# Patient Record
Sex: Male | Born: 1935 | Race: White | Hispanic: No | Marital: Married | Smoking: Former smoker
Health system: Southern US, Community
[De-identification: ages and names within clinical notes are randomized; demographics above are authoritative.]

## PROBLEM LIST (undated history)

## (undated) DIAGNOSIS — R972 Elevated prostate specific antigen [PSA]: Secondary | ICD-10-CM

## (undated) DIAGNOSIS — J449 Chronic obstructive pulmonary disease, unspecified: Secondary | ICD-10-CM

## (undated) DIAGNOSIS — C3490 Malignant neoplasm of unspecified part of unspecified bronchus or lung: Secondary | ICD-10-CM

## (undated) DIAGNOSIS — E785 Hyperlipidemia, unspecified: Secondary | ICD-10-CM

## (undated) DIAGNOSIS — R7303 Prediabetes: Secondary | ICD-10-CM

## (undated) HISTORY — DX: Chronic obstructive pulmonary disease, unspecified: J44.9

## (undated) HISTORY — DX: Hyperlipidemia, unspecified: E78.5

## (undated) HISTORY — DX: Malignant neoplasm of unspecified part of unspecified bronchus or lung: C34.90

## (undated) HISTORY — DX: Elevated prostate specific antigen (PSA): R97.20

## (undated) HISTORY — DX: Prediabetes: R73.03

---
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
## 1995-06-17 HISTORY — PX: KNEE SURGERY: SHX244

## 2006-08-21 ENCOUNTER — Inpatient Hospital Stay (HOSPITAL_COMMUNITY): Admission: EM | Admit: 2006-08-21 | Discharge: 2006-08-25 | Payer: Self-pay | Admitting: Emergency Medicine

## 2011-10-02 ENCOUNTER — Other Ambulatory Visit (HOSPITAL_COMMUNITY): Payer: Self-pay | Admitting: Internal Medicine

## 2011-10-02 ENCOUNTER — Ambulatory Visit (HOSPITAL_COMMUNITY)
Admission: RE | Admit: 2011-10-02 | Discharge: 2011-10-02 | Disposition: A | Discharge status: Home / Self Care | Payer: Medicare Other | Source: Ambulatory Visit | Attending: Internal Medicine | Admitting: Internal Medicine

## 2011-10-02 ENCOUNTER — Ambulatory Visit (HOSPITAL_COMMUNITY): Admission: RE | Admit: 2011-10-02 | Payer: Self-pay | Source: Ambulatory Visit

## 2011-10-02 ENCOUNTER — Other Ambulatory Visit (HOSPITAL_COMMUNITY): Payer: Self-pay

## 2011-10-02 DIAGNOSIS — J9819 Other pulmonary collapse: Secondary | ICD-10-CM | POA: Insufficient documentation

## 2011-10-02 DIAGNOSIS — J449 Chronic obstructive pulmonary disease, unspecified: Secondary | ICD-10-CM | POA: Insufficient documentation

## 2011-10-02 DIAGNOSIS — R05 Cough: Secondary | ICD-10-CM | POA: Insufficient documentation

## 2011-10-02 DIAGNOSIS — J4489 Other specified chronic obstructive pulmonary disease: Secondary | ICD-10-CM | POA: Insufficient documentation

## 2011-10-02 DIAGNOSIS — J209 Acute bronchitis, unspecified: Secondary | ICD-10-CM

## 2011-10-02 DIAGNOSIS — Z87891 Personal history of nicotine dependence: Secondary | ICD-10-CM | POA: Insufficient documentation

## 2011-10-02 DIAGNOSIS — J984 Other disorders of lung: Secondary | ICD-10-CM | POA: Insufficient documentation

## 2011-10-02 DIAGNOSIS — R059 Cough, unspecified: Secondary | ICD-10-CM | POA: Insufficient documentation

## 2011-10-06 ENCOUNTER — Other Ambulatory Visit: Payer: Self-pay | Admitting: Internal Medicine

## 2011-10-06 DIAGNOSIS — R05 Cough: Secondary | ICD-10-CM

## 2011-10-06 DIAGNOSIS — R9389 Abnormal findings on diagnostic imaging of other specified body structures: Secondary | ICD-10-CM

## 2011-10-08 ENCOUNTER — Ambulatory Visit
Admission: RE | Admit: 2011-10-08 | Discharge: 2011-10-08 | Disposition: A | Discharge status: Home / Self Care | Payer: BC Managed Care – PPO | Source: Ambulatory Visit | Attending: Internal Medicine | Admitting: Internal Medicine

## 2011-10-08 DIAGNOSIS — R05 Cough: Secondary | ICD-10-CM

## 2011-10-08 DIAGNOSIS — R9389 Abnormal findings on diagnostic imaging of other specified body structures: Secondary | ICD-10-CM

## 2011-10-13 ENCOUNTER — Other Ambulatory Visit: Payer: Self-pay | Admitting: Internal Medicine

## 2011-10-13 DIAGNOSIS — R911 Solitary pulmonary nodule: Secondary | ICD-10-CM

## 2011-11-03 ENCOUNTER — Ambulatory Visit
Admission: RE | Admit: 2011-11-03 | Discharge: 2011-11-03 | Disposition: A | Discharge status: Home / Self Care | Payer: Medicare Other | Source: Ambulatory Visit | Attending: Internal Medicine | Admitting: Internal Medicine

## 2011-11-03 DIAGNOSIS — R911 Solitary pulmonary nodule: Secondary | ICD-10-CM

## 2013-07-31 IMAGING — CT CT CHEST W/O CM
2 of 4 series · 15 of 36 positions shown, 18 images · non-contrast
Comparison: Chest x-ray dated 10/02/2011 and chest CT dated
08/21/2006

CLINICAL DATA: Cavitary lesion in the left upper lobe on chest x-
ray dated 10/02/2011 cough.

CT CHEST WITHOUT CONTRAST
TECHNIQUE: Multidetector CT imaging of the chest was performed
following the standard protocol without IV contrast.

[Series 2: chest w/o · axial · non-contrast · 0.78mm/px · z∈[-242,+8]mm · 12 of 60 slices shown, 15 images]
[im 5/60  mediastinal]
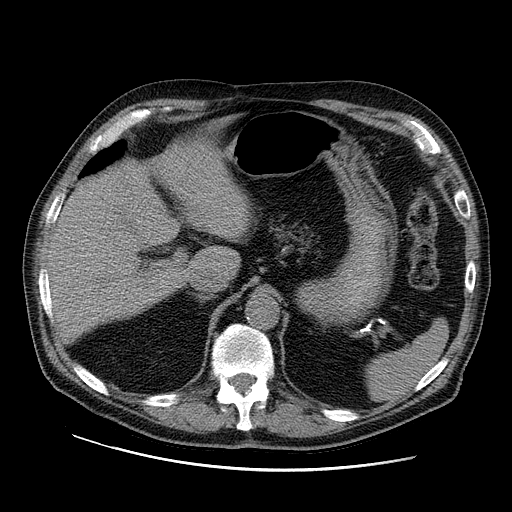
[im 5/60  lung]
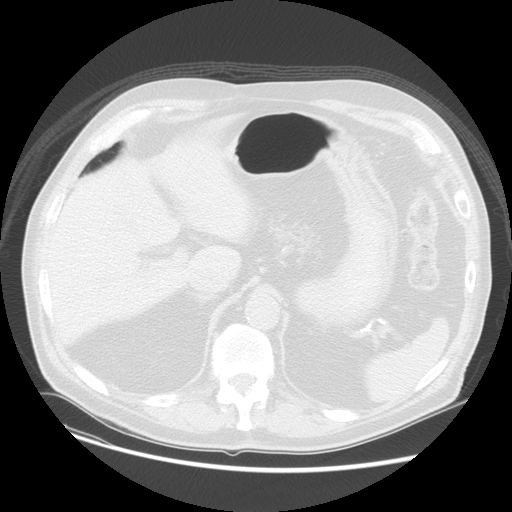
[im 10/60  lung]
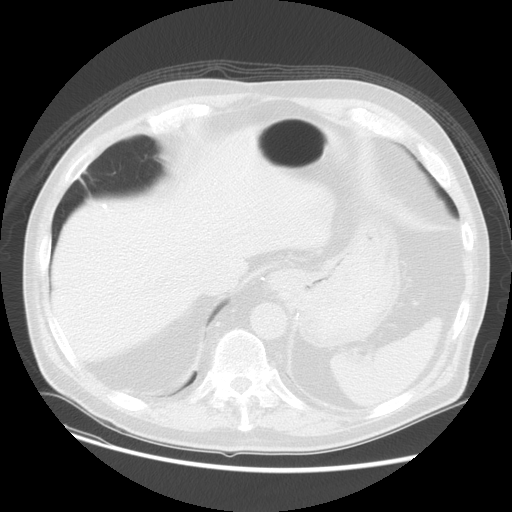
[im 14/60  lung]
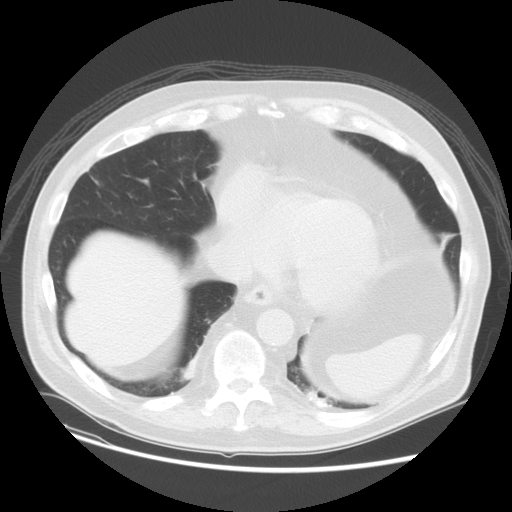
[im 19/60  lung]
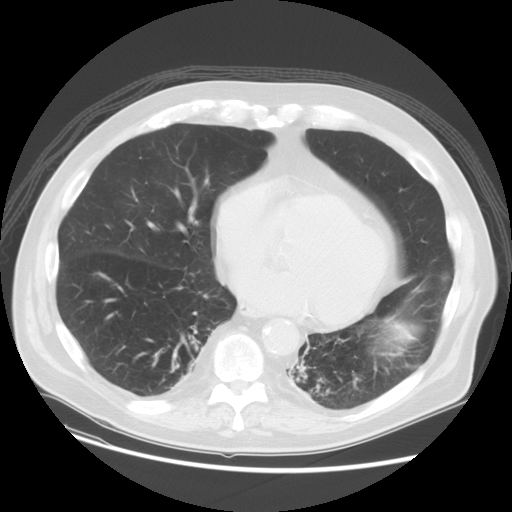
[im 23/60  mediastinal]
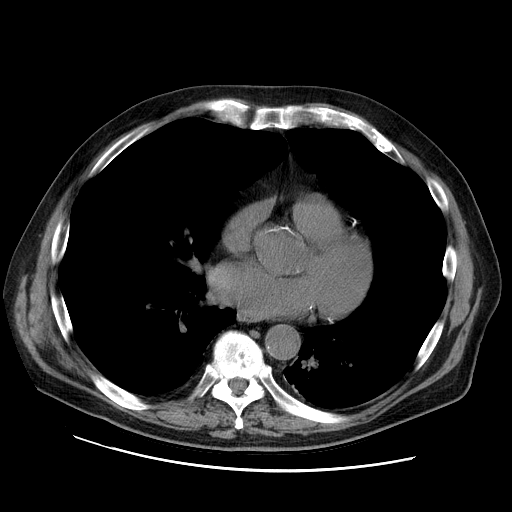
[im 23/60  lung]
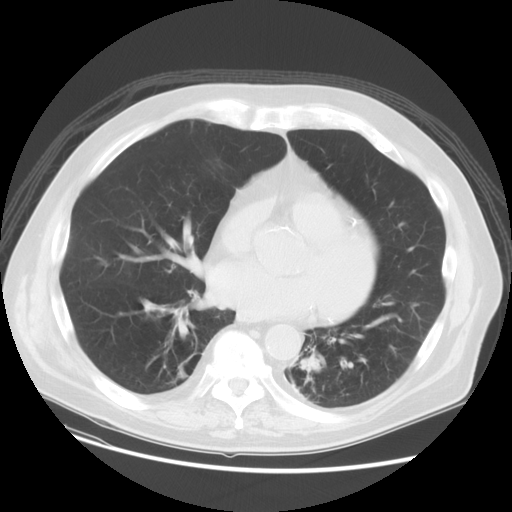
[im 28/60  lung]
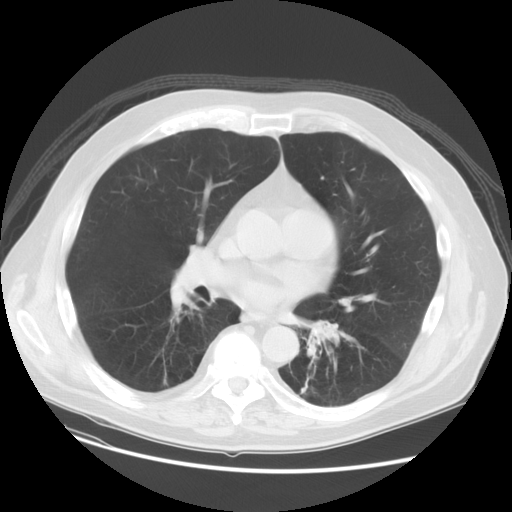
[im 32/60  lung]
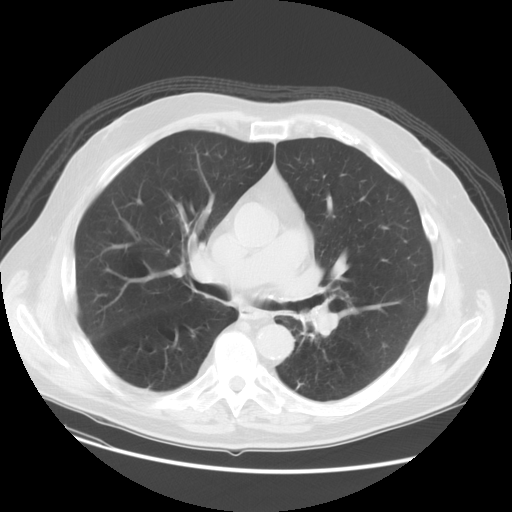
[im 37/60  lung]
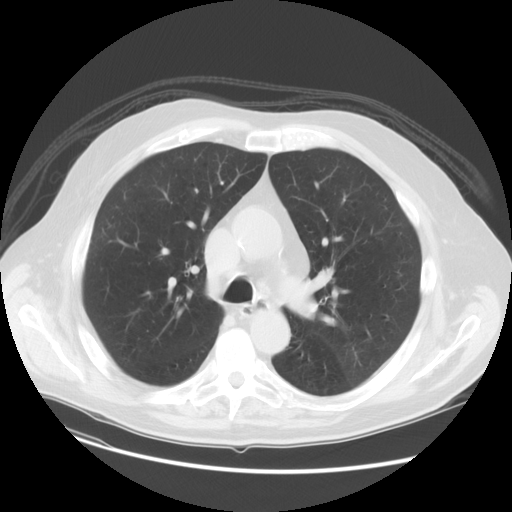
[im 41/60  mediastinal]
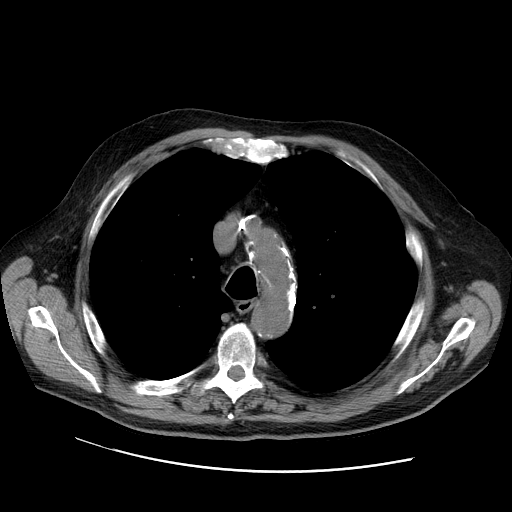
[im 41/60  lung]
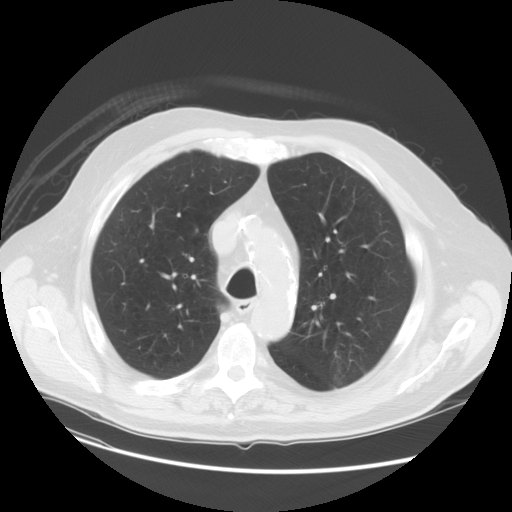
[im 46/60  lung]
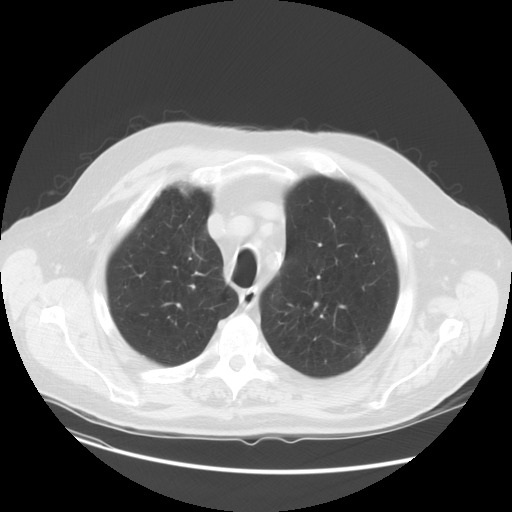
[im 50/60  lung]
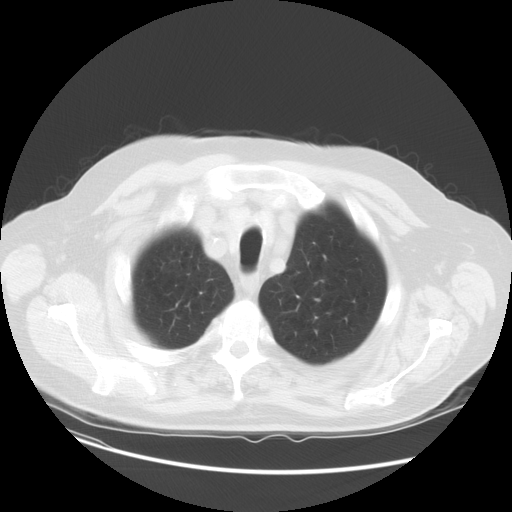
[im 55/60  lung]
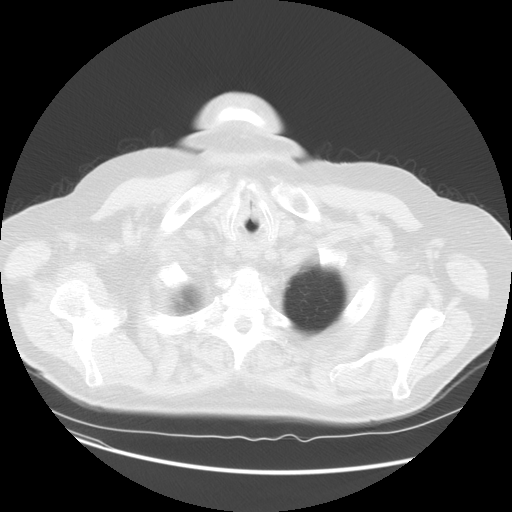

[Series 400: coronal · coronal · 0.78mm/px · 3 of 126 slices shown]
[im 26/126  lung]
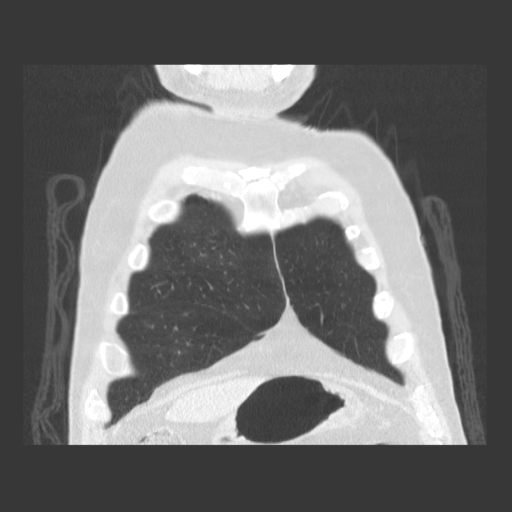
[im 51/126  lung]
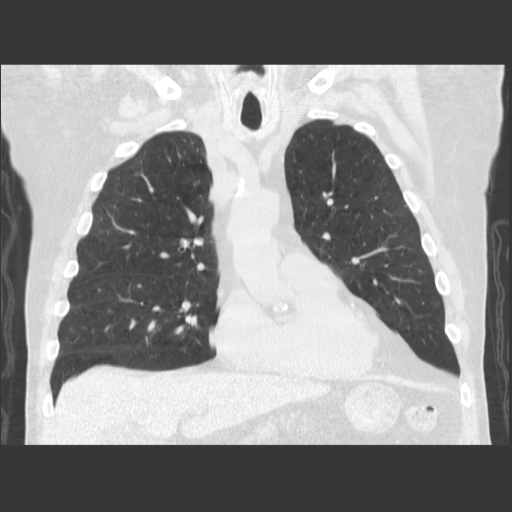
[im 76/126  lung]
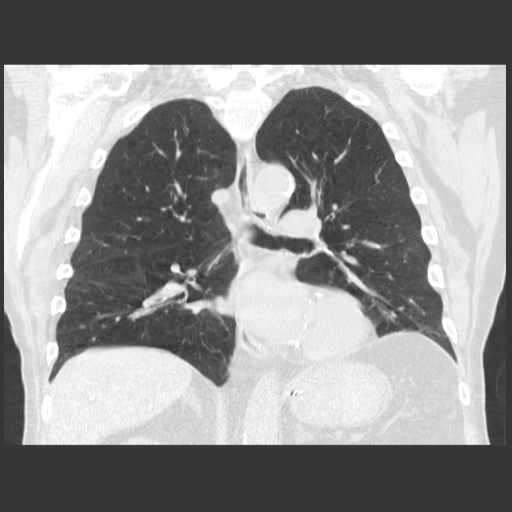

[15 of 36 positions shown; findings below may reference images not displayed]

FINDINGS: There is a spiculated thin walled cavitary lesion in the
left upper lobe measuring  12.7 x 11.6 x 8 12.5 mm. This could
represent a focal lung abscess or cavitary carcinoma of the lung.
There is no definable hilar or mediastinal adenopathy.

The patient does have bronchiectasis in the left lower lobe with
extensive secretions filling the majority of the left lower lobe
bronchi and causing significant left lower lobe volume loss.

The patient has emphysematous disease primarily in the upper lobes.

Heart size is normal.

The visualized portion of the upper abdomen is normal.
IMPRESSION: 1.  Spiculated thin-walled cavitary lesion in the left upper lobe.
This could represent a cavitary carcinoma or a focal lung abscess.
2.  Extensive bronchiectasis and secretions in the bronchi in the
left lower lobe causing left lower lobe volume loss.

## 2013-08-24 DIAGNOSIS — C3490 Malignant neoplasm of unspecified part of unspecified bronchus or lung: Secondary | ICD-10-CM | POA: Insufficient documentation

## 2013-08-24 DIAGNOSIS — E785 Hyperlipidemia, unspecified: Secondary | ICD-10-CM | POA: Insufficient documentation

## 2013-08-24 DIAGNOSIS — J449 Chronic obstructive pulmonary disease, unspecified: Secondary | ICD-10-CM | POA: Insufficient documentation

## 2013-08-24 DIAGNOSIS — R7303 Prediabetes: Secondary | ICD-10-CM | POA: Insufficient documentation

## 2013-08-29 ENCOUNTER — Ambulatory Visit (INDEPENDENT_AMBULATORY_CARE_PROVIDER_SITE_OTHER): Payer: Medicare Other | Admitting: Internal Medicine

## 2013-08-29 ENCOUNTER — Encounter: Payer: Self-pay | Admitting: Internal Medicine

## 2013-08-29 VITALS — BP 162/84 | HR 80 | Temp 97.7°F | Resp 18 | Ht 69.0 in | Wt 185.4 lb

## 2013-08-29 DIAGNOSIS — R7303 Prediabetes: Secondary | ICD-10-CM

## 2013-08-29 DIAGNOSIS — G309 Alzheimer's disease, unspecified: Secondary | ICD-10-CM

## 2013-08-29 DIAGNOSIS — I10 Essential (primary) hypertension: Secondary | ICD-10-CM | POA: Insufficient documentation

## 2013-08-29 DIAGNOSIS — E785 Hyperlipidemia, unspecified: Secondary | ICD-10-CM

## 2013-08-29 DIAGNOSIS — Z1212 Encounter for screening for malignant neoplasm of rectum: Secondary | ICD-10-CM

## 2013-08-29 DIAGNOSIS — Z79899 Other long term (current) drug therapy: Secondary | ICD-10-CM

## 2013-08-29 DIAGNOSIS — E782 Mixed hyperlipidemia: Secondary | ICD-10-CM

## 2013-08-29 DIAGNOSIS — Z125 Encounter for screening for malignant neoplasm of prostate: Secondary | ICD-10-CM

## 2013-08-29 DIAGNOSIS — F0280 Dementia in other diseases classified elsewhere without behavioral disturbance: Secondary | ICD-10-CM

## 2013-08-29 DIAGNOSIS — E559 Vitamin D deficiency, unspecified: Secondary | ICD-10-CM

## 2013-08-29 LAB — HEPATIC FUNCTION PANEL
ALBUMIN: 3.9 g/dL (ref 3.5–5.2)
ALK PHOS: 71 U/L (ref 39–117)
ALT: 14 U/L (ref 0–53)
AST: 23 U/L (ref 0–37)
BILIRUBIN INDIRECT: 0.4 mg/dL (ref 0.2–1.2)
Bilirubin, Direct: 0.1 mg/dL (ref 0.0–0.3)
TOTAL PROTEIN: 5.9 g/dL — AB (ref 6.0–8.3)
Total Bilirubin: 0.5 mg/dL (ref 0.2–1.2)

## 2013-08-29 LAB — CBC WITH DIFFERENTIAL/PLATELET
BASOS ABS: 0.1 10*3/uL (ref 0.0–0.1)
BASOS PCT: 1 % (ref 0–1)
EOS PCT: 2 % (ref 0–5)
Eosinophils Absolute: 0.2 10*3/uL (ref 0.0–0.7)
HEMATOCRIT: 37.3 % — AB (ref 39.0–52.0)
Hemoglobin: 12.3 g/dL — ABNORMAL LOW (ref 13.0–17.0)
LYMPHS PCT: 23 % (ref 12–46)
Lymphs Abs: 1.8 10*3/uL (ref 0.7–4.0)
MCH: 29.9 pg (ref 26.0–34.0)
MCHC: 33 g/dL (ref 30.0–36.0)
MCV: 90.8 fL (ref 78.0–100.0)
MONO ABS: 0.6 10*3/uL (ref 0.1–1.0)
Monocytes Relative: 8 % (ref 3–12)
Neutro Abs: 5.1 10*3/uL (ref 1.7–7.7)
Neutrophils Relative %: 66 % (ref 43–77)
PLATELETS: 301 10*3/uL (ref 150–400)
RBC: 4.11 MIL/uL — ABNORMAL LOW (ref 4.22–5.81)
RDW: 14.1 % (ref 11.5–15.5)
WBC: 7.7 10*3/uL (ref 4.0–10.5)

## 2013-08-29 LAB — BASIC METABOLIC PANEL WITH GFR
BUN: 17 mg/dL (ref 6–23)
CALCIUM: 9 mg/dL (ref 8.4–10.5)
CO2: 27 mEq/L (ref 19–32)
CREATININE: 1.12 mg/dL (ref 0.50–1.35)
Chloride: 105 mEq/L (ref 96–112)
GFR, EST NON AFRICAN AMERICAN: 58 mL/min — AB
GFR, Est African American: 67 mL/min
Glucose, Bld: 97 mg/dL (ref 70–99)
Potassium: 4.5 mEq/L (ref 3.5–5.3)
Sodium: 140 mEq/L (ref 135–145)

## 2013-08-29 LAB — HEMOGLOBIN A1C
Hgb A1c MFr Bld: 5.9 % — ABNORMAL HIGH (ref <5.7)
Mean Plasma Glucose: 123 mg/dL — ABNORMAL HIGH (ref <117)

## 2013-08-29 LAB — MAGNESIUM: Magnesium: 1.9 mg/dL (ref 1.5–2.5)

## 2013-08-29 LAB — LIPID PANEL
CHOL/HDL RATIO: 3.3 ratio
CHOLESTEROL: 154 mg/dL (ref 0–200)
HDL: 47 mg/dL (ref >39)
LDL Cholesterol: 86 mg/dL (ref 0–99)
TRIGLYCERIDES: 105 mg/dL (ref <150)
VLDL: 21 mg/dL (ref 0–40)

## 2013-08-29 MED ORDER — DONEPEZIL HCL 10 MG PO TABS
10.0000 mg | ORAL_TABLET | Freq: Every day | ORAL | Status: AC
Start: 2013-08-29 — End: ?

## 2013-08-29 MED ORDER — MEMANTINE HCL ER 14 MG PO CP24: 14.0000 mg | ORAL_CAPSULE | Freq: Every morning | ORAL | Status: DC

## 2013-08-29 NOTE — Patient Instructions (Signed)

## 2013-08-29 NOTE — Progress Notes (Signed)
Patient ID: Logan Grimes, male   DOB: January 17, 1922, 80 y.o.   MRN: [id number]   Annual Screening Comprehensive Examination  This very nice 80 y.o.  WWM brought in by his daughter and  presents for complete physical.  Patient has been followed for HTN, Prediabetes, Hyperlipidemia, and Vitamin D Deficiency.   HTN predates since 2008. Patient's BP has been controlled at home.Today's BP: 162/84 mmHg. Patient denies any cardiac symptoms as chest pain, palpitations, shortness of breath, dizziness or ankle swelling.   Patient's hyperlipidemia is controlled with diet and medications. Patient denies myalgias or other medication SE's. Last cholesterol last visit was 176, triglycerides 110, HDL 49 and LDL 65 in Sept 2014.Marland Kitchen     Patient has prediabetes with last A1c 6.0% in Sept.2014. Patient denies reactive hypoglycemic symptoms, visual blurring, diabetic polys, or paresthesias.    Patient has Hx/o suspected LUL Lung Cancer followed by [facility] in [place].   Finally, patient has history of Vitamin D Deficiency with last vitamin D of 75 in Sept 2014  Medication Sig  . BABY ASPIRIN PO Take 81 mg by mouth daily.  . Cholecalciferol (VITAMIN D PO) Take 2,000 mg by mouth daily.  Marland Kitchen losartan (COZAAR) 50 MG tablet Take 50 mg by mouth daily.  . Saw Palmetto, Serenoa repens, (SAW PALMETTO PO) Take by mouth daily.  Marland Kitchen tiotropium (SPIRIVA) 18 MCG inhalation capsule Place 18 mcg into inhaler and inhale daily.   No Known Allergies  Past Medical History  Diagnosis Date  . Hyperlipidemia   . COPD (chronic obstructive pulmonary disease)   . Prediabetes   . Elevated PSA   . Lung cancer     Past Surgical History  Procedure Laterality Date  . Knee surgery  1997  . Other surgical history  1950    STOMACH ULCER    Family History  Problem Relation Age of Onset  . Goiter Mother   . Pneumonia Father   . Heart attack Brother   . Kidney disease Brother     History   Social  History  . Marital Status: Married    Spouse Name: N/A    Number of Children: N/A  . Years of Education: N/A    Social History Main Topics  . Smoking status: Former Research scientist (life sciences)  . Smokeless tobacco: Not on file  . Alcohol Use: No  . Drug Use: No  . Sexual Activity: Not on file    ROS Constitutional: Denies fever, chills, weight loss/gain, headaches, insomnia, fatigue, night sweats, and change in appetite. Eyes: Denies redness, blurred vision, diplopia, discharge, itchy, watery eyes.  ENT: Denies discharge, congestion, post nasal drip, epistaxis, sore throat, earache, hearing loss, dental pain, Tinnitus, Vertigo, Sinus pain, snoring.  Cardio: Denies chest pain, palpitations, irregular heartbeat, syncope, dyspnea, diaphoresis, orthopnea, PND, claudication, edema Respiratory: denies cough, dyspnea, DOE, pleurisy, hoarseness, laryngitis, wheezing.  Gastrointestinal: Denies dysphagia, heartburn, reflux, water brash, pain, cramps, nausea, vomiting, bloating, diarrhea, constipation, hematemesis, melena, hematochezia, jaundice, hemorrhoids Genitourinary: Denies dysuria, frequency, urgency, nocturia, hesitancy, discharge, hematuria, flank pain Musculoskeletal: Denies arthralgia, myalgia, stiffness, Jt. Swelling, pain, limp, and strain/sprain. Skin: Denies puritis, rash, hives, warts, acne, eczema, changing in skin lesion Neuro: No weakness, tremor, incoordination, spasms, paresthesia, pain Psychiatric: Denies confusion, memory loss, sensory loss Endocrine: Denies change in weight, skin, hair change, nocturia, and paresthesia, diabetic polys, visual blurring, hyper / hypo glycemic episodes.  Heme/Lymph: No excessive bleeding, bruising, or elarged lymph nodes.  Physical Exam  BP 162/84  Pulse 80  Temp(Src) 97.7 F (36.5 C) (Temporal)  Resp 18  Ht 5\' 9"  (1.753 m)  Wt 185 lb 6.4 oz (84.097 kg)  BMI 27.37 kg/m2  General Appearance: Well nourished, in no apparent distress. Eyes: PERRLA, EOMs,  conjunctiva no swelling or erythema, normal fundi and vessels. Sinuses: No frontal/maxillary tenderness ENT/Mouth: EACs patent / TMs  nl. Nares clear without erythema, swelling, mucoid exudates. Oral hygiene is good. No erythema, swelling, or exudate. Tongue normal, non-obstructing. Tonsils not swollen or erythematous. Hearing normal.  Neck: Supple, thyroid normal. No bruits, nodes or JVD. Respiratory: Respiratory effort normal.  BS equal and clear bilateral without rales, rhonci, wheezing or stridor. Cardio: Heart sounds are normal with regular rate and rhythm and no murmurs, rubs or gallops. Peripheral pulses are normal and equal bilaterally without edema. No aortic or femoral bruits. Chest: symmetric with normal excursions and percussion.  Abdomen: Flat, soft, with bowl sounds. Nontender, no guarding, rebound, hernias, masses, or organomegaly.  Lymphatics: Non tender without lymphadenopathy.  Rectal deferred due to age. Musculoskeletal: Full ROM all peripheral extremities, joint stability, 5/5 strength, and normal gait. Skin: Warm and dry without rashes, lesions, cyanosis, clubbing or  ecchymosis.  Neuro: Cranial nerves intact, reflexes equal bilaterally. Normal muscle tone, no cerebellar symptoms. Sensation intact.  Pysch: Awake and oriented X 3, normal affect, insight and judgment appropriate.   Assessment and Plan  1. Annual Screening Examination 2. Hypertension  3. Hyperlipidemia 4. Pre Diabetes 5. Vitamin D Deficiency 6. Hx Lung Cancer  Continue prudent diet as discussed, weight control, BP monitoring, regular exercise, and medications as discussed.  Discussed med effects and SE's. Routine screening labs and tests as requested with regular follow-up as recommended.

## 2013-08-30 LAB — MICROALBUMIN / CREATININE URINE RATIO
CREATININE, URINE: 84.9 mg/dL
MICROALB UR: 2.78 mg/dL — AB (ref 0.00–1.89)
Microalb Creat Ratio: 32.7 mg/g — ABNORMAL HIGH (ref 0.0–30.0)

## 2013-08-30 LAB — URINALYSIS, MICROSCOPIC ONLY
BACTERIA UA: NONE SEEN
Casts: NONE SEEN
Crystals: NONE SEEN
Squamous Epithelial / LPF: NONE SEEN

## 2013-08-30 LAB — VITAMIN D 25 HYDROXY (VIT D DEFICIENCY, FRACTURES): VIT D 25 HYDROXY: 77 ng/mL (ref 30–89)

## 2013-08-30 LAB — INSULIN, FASTING: INSULIN FASTING, SERUM: 16 u[IU]/mL (ref 3–28)

## 2013-08-30 LAB — PSA: PSA: 9.94 ng/mL — ABNORMAL HIGH (ref <=4.00)

## 2013-08-30 LAB — TSH: TSH: 1.203 u[IU]/mL (ref 0.350–4.500)

## 2013-12-05 ENCOUNTER — Ambulatory Visit (INDEPENDENT_AMBULATORY_CARE_PROVIDER_SITE_OTHER): Payer: Medicare Other | Admitting: Physician Assistant

## 2013-12-05 ENCOUNTER — Encounter: Payer: Self-pay | Admitting: Physician Assistant

## 2013-12-05 VITALS — BP 142/88 | HR 80 | Temp 97.9°F | Resp 16 | Wt 186.0 lb

## 2013-12-05 DIAGNOSIS — R7309 Other abnormal glucose: Secondary | ICD-10-CM

## 2013-12-05 DIAGNOSIS — C3430 Malignant neoplasm of lower lobe, unspecified bronchus or lung: Secondary | ICD-10-CM

## 2013-12-05 DIAGNOSIS — Z0000 Encounter for general adult medical examination without abnormal findings: Secondary | ICD-10-CM

## 2013-12-05 DIAGNOSIS — G309 Alzheimer's disease, unspecified: Secondary | ICD-10-CM

## 2013-12-05 DIAGNOSIS — J431 Panlobular emphysema: Secondary | ICD-10-CM

## 2013-12-05 DIAGNOSIS — Z79899 Other long term (current) drug therapy: Secondary | ICD-10-CM

## 2013-12-05 DIAGNOSIS — C3432 Malignant neoplasm of lower lobe, left bronchus or lung: Secondary | ICD-10-CM

## 2013-12-05 DIAGNOSIS — J438 Other emphysema: Secondary | ICD-10-CM

## 2013-12-05 DIAGNOSIS — I10 Essential (primary) hypertension: Secondary | ICD-10-CM

## 2013-12-05 DIAGNOSIS — Z9181 History of falling: Secondary | ICD-10-CM

## 2013-12-05 DIAGNOSIS — Z1331 Encounter for screening for depression: Secondary | ICD-10-CM

## 2013-12-05 DIAGNOSIS — E785 Hyperlipidemia, unspecified: Secondary | ICD-10-CM

## 2013-12-05 DIAGNOSIS — F0280 Dementia in other diseases classified elsewhere without behavioral disturbance: Secondary | ICD-10-CM

## 2013-12-05 DIAGNOSIS — R7303 Prediabetes: Secondary | ICD-10-CM

## 2013-12-05 LAB — CBC WITH DIFFERENTIAL/PLATELET
BASOS ABS: 0.1 10*3/uL (ref 0.0–0.1)
BASOS PCT: 1 % (ref 0–1)
EOS ABS: 0.2 10*3/uL (ref 0.0–0.7)
Eosinophils Relative: 3 % (ref 0–5)
HCT: 38.2 % — ABNORMAL LOW (ref 39.0–52.0)
HEMOGLOBIN: 12.6 g/dL — AB (ref 13.0–17.0)
Lymphocytes Relative: 23 % (ref 12–46)
Lymphs Abs: 1.8 10*3/uL (ref 0.7–4.0)
MCH: 29.7 pg (ref 26.0–34.0)
MCHC: 33 g/dL (ref 30.0–36.0)
MCV: 90.1 fL (ref 78.0–100.0)
Monocytes Absolute: 0.7 10*3/uL (ref 0.1–1.0)
Monocytes Relative: 9 % (ref 3–12)
NEUTROS ABS: 5.1 10*3/uL (ref 1.7–7.7)
NEUTROS PCT: 64 % (ref 43–77)
PLATELETS: 257 10*3/uL (ref 150–400)
RBC: 4.24 MIL/uL (ref 4.22–5.81)
RDW: 14 % (ref 11.5–15.5)
WBC: 7.9 10*3/uL (ref 4.0–10.5)

## 2013-12-05 LAB — BASIC METABOLIC PANEL WITH GFR
BUN: 27 mg/dL — ABNORMAL HIGH (ref 6–23)
CALCIUM: 9.3 mg/dL (ref 8.4–10.5)
CO2: 27 mEq/L (ref 19–32)
Chloride: 107 mEq/L (ref 96–112)
Creat: 1.37 mg/dL — ABNORMAL HIGH (ref 0.50–1.35)
GFR, EST AFRICAN AMERICAN: 52 mL/min — AB
GFR, EST NON AFRICAN AMERICAN: 45 mL/min — AB
GLUCOSE: 65 mg/dL — AB (ref 70–99)
POTASSIUM: 5 meq/L (ref 3.5–5.3)
SODIUM: 141 meq/L (ref 135–145)

## 2013-12-05 LAB — LIPID PANEL
CHOLESTEROL: 167 mg/dL (ref 0–200)
HDL: 48 mg/dL (ref >39)
LDL Cholesterol: 98 mg/dL (ref 0–99)
TRIGLYCERIDES: 104 mg/dL (ref <150)
Total CHOL/HDL Ratio: 3.5 Ratio
VLDL: 21 mg/dL (ref 0–40)

## 2013-12-05 LAB — HEPATIC FUNCTION PANEL
ALT: 14 U/L (ref 0–53)
AST: 24 U/L (ref 0–37)
Albumin: 4.1 g/dL (ref 3.5–5.2)
Alkaline Phosphatase: 88 U/L (ref 39–117)
BILIRUBIN DIRECT: 0.1 mg/dL (ref 0.0–0.3)
BILIRUBIN INDIRECT: 0.4 mg/dL (ref 0.2–1.2)
BILIRUBIN TOTAL: 0.5 mg/dL (ref 0.2–1.2)
Total Protein: 6.3 g/dL (ref 6.0–8.3)

## 2013-12-05 LAB — HEMOGLOBIN A1C
Hgb A1c MFr Bld: 5.9 % — ABNORMAL HIGH (ref <5.7)
MEAN PLASMA GLUCOSE: 123 mg/dL — AB (ref <117)

## 2013-12-05 LAB — TSH: TSH: 1.028 u[IU]/mL (ref 0.350–4.500)

## 2013-12-05 LAB — MAGNESIUM: Magnesium: 2 mg/dL (ref 1.5–2.5)

## 2013-12-05 MED ORDER — HYDROCODONE-ACETAMINOPHEN 5-325 MG PO TABS: 1.0000 | ORAL_TABLET | Freq: Four times a day (QID) | ORAL | Status: AC | PRN

## 2013-12-05 MED ORDER — LOSARTAN POTASSIUM 50 MG PO TABS: 50.0000 mg | ORAL_TABLET | Freq: Every day | ORAL | Status: DC

## 2013-12-05 MED ORDER — MEMANTINE HCL 10 MG PO TABS: 10.0000 mg | ORAL_TABLET | Freq: Two times a day (BID) | ORAL | Status: AC

## 2013-12-05 MED ORDER — TIOTROPIUM BROMIDE MONOHYDRATE 18 MCG IN CAPS: 18.0000 ug | ORAL_CAPSULE | Freq: Every day | RESPIRATORY_TRACT | Status: DC

## 2013-12-05 NOTE — Patient Instructions (Signed)
Use a dropper to put olive oil or canola oil in the effected ear- 2-3 times a week. Let it soak for 20-30 min then you can take a shower or use a baby bulb with warm water to wash out the ear wax.  Do not use Qtips  Preventative Care for Adults, Male       REGULAR HEALTH EXAMS:  A routine yearly physical is a good way to check in with your primary care provider about your health and preventive screening. It is also an opportunity to share updates about your health and any concerns you have, and receive a thorough all-over exam.   Most health insurance companies pay for at least some preventative services.  Check with your health plan for specific coverages.  WHAT PREVENTATIVE SERVICES DO MEN NEED?  Adult men should have their weight and blood pressure checked regularly.   Men age 35 and older should have their cholesterol levels checked regularly.  Beginning at age 50 and continuing to age 75, men should be screened for colorectal cancer.  Certain people should may need continued testing until age 85.  Other cancer screening may include exams for testicular and prostate cancer.  Updating vaccinations is part of preventative care.  Vaccinations help protect against diseases such as the flu.  Lab tests are generally done as part of preventative care to screen for anemia and blood disorders, to screen for problems with the kidneys and liver, to screen for bladder problems, to check blood sugar, and to check your cholesterol level.  Preventative services generally include counseling about diet, exercise, avoiding tobacco, drugs, excessive alcohol consumption, and sexually transmitted infections.    GENERAL RECOMMENDATIONS FOR GOOD HEALTH:  Healthy diet:  Eat a variety of foods, including fruit, vegetables, animal or vegetable protein, such as meat, fish, chicken, and eggs, or beans, lentils, tofu, and grains, such as rice.  Drink plenty of water daily.  Decrease saturated fat in the  diet, avoid lots of red meat, processed foods, sweets, fast foods, and fried foods.  Exercise:  Aerobic exercise helps maintain good heart health. At least 30-40 minutes of moderate-intensity exercise is recommended. For example, a brisk walk that increases your heart rate and breathing. This should be done on most days of the week.   Find a type of exercise or a variety of exercises that you enjoy so that it becomes a part of your daily life.  Examples are running, walking, swimming, water aerobics, and biking.  For motivation and support, explore group exercise such as aerobic class, spin class, Zumba, Yoga,or  martial arts, etc.    Set exercise goals for yourself, such as a certain weight goal, walk or run in a race such as a 5k walk/run.  Speak to your primary care provider about exercise goals.  Disease prevention:  If you smoke or chew tobacco, find out from your caregiver how to quit. It can literally save your life, no matter how long you have been a tobacco user. If you do not use tobacco, never begin.   Maintain a healthy diet and normal weight. Increased weight leads to problems with blood pressure and diabetes.   The Body Mass Index or BMI is a way of measuring how much of your body is fat. Having a BMI above 27 increases the risk of heart disease, diabetes, hypertension, stroke and other problems related to obesity. Your caregiver can help determine your BMI and based on it develop an exercise and dietary program to   help you achieve or maintain this important measurement at a healthful level.  High blood pressure causes heart and blood vessel problems.  Persistent high blood pressure should be treated with medicine if weight loss and exercise do not work.   Fat and cholesterol leaves deposits in your arteries that can block them. This causes heart disease and vessel disease elsewhere in your body.  If your cholesterol is found to be high, or if you have heart disease or certain other  medical conditions, then you may need to have your cholesterol monitored frequently and be treated with medication.   Ask if you should have a stress test if your history suggests this. A stress test is a test done on a treadmill that looks for heart disease. This test can find disease prior to there being a problem.  Avoid drinking alcohol in excess (more than two drinks per day).  Avoid use of street drugs. Do not share needles with anyone. Ask for professional help if you need assistance or instructions on stopping the use of alcohol, cigarettes, and/or drugs.  Brush your teeth twice a day with fluoride toothpaste, and floss once a day. Good oral hygiene prevents tooth decay and gum disease. The problems can be painful, unattractive, and can cause other health problems. Visit your dentist for a routine oral and dental check up and preventive care every 6-12 months.   Look at your skin regularly.  Use a mirror to look at your back. Notify your caregivers of changes in moles, especially if there are changes in shapes, colors, a size larger than a pencil eraser, an irregular border, or development of new moles.  Safety:  Use seatbelts 100% of the time, whether driving or as a passenger.  Use safety devices such as hearing protection if you work in environments with loud noise or significant background noise.  Use safety glasses when doing any work that could send debris in to the eyes.  Use a helmet if you ride a bike or motorcycle.  Use appropriate safety gear for contact sports.  Talk to your caregiver about gun safety.  Use sunscreen with a SPF (or skin protection factor) of 15 or greater.  Lighter skinned people are at a greater risk of skin cancer. Don't forget to also wear sunglasses in order to protect your eyes from too much damaging sunlight. Damaging sunlight can accelerate cataract formation.   Practice safe sex. Use condoms. Condoms are used for birth control and to help reduce the spread  of sexually transmitted infections (or STIs).  Some of the STIs are gonorrhea (the clap), chlamydia, syphilis, trichomonas, herpes, HPV (human papilloma virus) and HIV (human immunodeficiency virus) which causes AIDS. The herpes, HIV and HPV are viral illnesses that have no cure. These can result in disability, cancer and death.   Keep carbon monoxide and smoke detectors in your home functioning at all times. Change the batteries every 6 months or use a model that plugs into the wall.   Vaccinations:  Stay up to date with your tetanus shots and other required immunizations. You should have a booster for tetanus every 10 years. Be sure to get your flu shot every year, since 5%-20% of the [place] population comes down with the flu. The flu vaccine changes each year, so being vaccinated once is not enough. Get your shot in the fall, before the flu season peaks.   Other vaccines to consider:  Pneumococcal vaccine to protect against certain types of pneumonia.  This   is normally recommended for adults age 65 or older.  However, adults younger than 78 years old with certain underlying conditions such as diabetes, heart or lung disease should also receive the vaccine.  Shingles vaccine to protect against Varicella Zoster if you are older than age 60, or younger than 78 years old with certain underlying illness.  Hepatitis A vaccine to protect against a form of infection of the liver by a virus acquired from food.  Hepatitis B vaccine to protect against a form of infection of the liver by a virus acquired from blood or body fluids, particularly if you work in health care.  If you plan to travel internationally, check with your local health department for specific vaccination recommendations.  Cancer Screening:  Most routine colon cancer screening begins at the age of 50. On a yearly basis, doctors may provide special easy to use take-home tests to check for hidden blood in the stool. Sigmoidoscopy or  colonoscopy can detect the earliest forms of colon cancer and is life saving. These tests use a small camera at the end of a tube to directly examine the colon. Speak to your caregiver about this at age 50, when routine screening begins (and is repeated every 5 years unless early forms of pre-cancerous polyps or small growths are found).   At the age of 50 men usually start screening for prostate cancer every year. Screening may begin at a younger age for those with higher risk. Those at higher risk include African-Americans or having a family history of prostate cancer. There are two types of tests for prostate cancer:   Prostate-specific antigen (PSA) testing. Recent studies raise questions about prostate cancer using PSA and you should discuss this with your caregiver.   Digital rectal exam (in which your doctor's lubricated and gloved finger feels for enlargement of the prostate through the anus).   Screening for testicular cancer.  Do a monthly exam of your testicles. Gently roll each testicle between your thumb and fingers, feeling for any abnormal lumps. The best time to do this is after a hot shower or bath when the tissues are looser. Notify your caregivers of any lumps, tenderness or changes in size or shape immediately.     

## 2013-12-05 NOTE — Progress Notes (Addendum)
MEDICARE ANNUAL WELLNESS VISIT AND FOLLOW UP Assessment:   1. Hypertension - CBC with Differential - BASIC METABOLIC PANEL WITH GFR - Hepatic function panel - TSH  2. Panlobular emphysema spirva refilled and samples given  3. Prediabetes Discussed general issues about diabetes pathophysiology and management., Educational material distributed., Suggested low cholesterol diet., Encouraged aerobic exercise., Discussed foot care., Reminded to get yearly retinal exam. - Hemoglobin A1c  4. SDAT Continue Aricept and Namenda, switch namenda to regular from ER due to cost  5. Hyperlipidemia - Lipid panel  6. Encounter for long-term (current) use of other medications - Magnesium  7. Malignant neoplasm of lower lobe of left lung Monitor, no treatment at this time, cont f/u [facility] in [place].  8. High fall risk Discussed with daughter possible PT/home fall assessment, does not want it done at this time. Would have to be home health due to taxing effort to leave home.   Plan:   During the course of the visit the patient was educated and counseled about appropriate screening and preventive services including:    Pneumococcal vaccine   Influenza vaccine  Td vaccine  Screening electrocardiogram  Colorectal cancer screening  Diabetes screening  Glaucoma screening  Nutrition counseling   Screening recommendations, referrals: Vaccinations: Tdap vaccine not indicated Influenza vaccine declined Pneumococcal vaccine declined Shingles vaccine declined Hep B vaccine not indicated  Nutrition assessed and recommended  Colonoscopy declined Recommended yearly ophthalmology/optometry visit for glaucoma screening and checkup Recommended yearly dental visit for hygiene and checkup Advanced directives - requested  Conditions/risks identified: BMI: Discussed weight loss, diet, and increase physical activity.  Increase physical activity: AHA recommends  150 minutes of physical activity a week.  Medications reviewed Diabetes is at goal, ACE/ARB therapy: Yes. Urinary Incontinence is not an issue: discussed non pharmacology and pharmacology options.  Fall risk: high- discussed PT, home fall assessment, medications.    Subjective:  Logan Grimes is a 80 y.o. male who presents for Medicare Annual Wellness Visit and 3 month follow up for HTN, hyperlipidemia, prediabetes, and vitamin D Def.  Date of last medicare wellness visit was is unknown.  His blood pressure has been controlled at home, today their BP is BP: 142/88 mmHg He does not workout. He denies chest pain, shortness of breath, dizziness.  He is not on cholesterol medication and denies myalgias. His cholesterol is at goal. The cholesterol last visit was:   Lab Results  Component Value Date   CHOL 154 08/29/2013   HDL 47 08/29/2013   LDLCALC 86 08/29/2013   TRIG 105 08/29/2013   CHOLHDL 3.3 08/29/2013   He has not been working on diet and exercise for prediabetes, and denies polydipsia and polyuria. Last A1C in the office was:  Lab Results  Component Value Date   HGBA1C 5.9* 08/29/2013   Patient is on Vitamin D supplement.   Daughter is with him today, patient is still at home. She states that his namenda ER is very expensive, will try to switch him to the regular.  He is on Norco 1/2 rarely for arthrtis and states it helps.    Names of Other Physician/Practitioners you currently use: 1. [facility] here for primary care 2. Dr. Kathrin Penner and sees retina specialist for Griselda Miner, eye doctor, last visit Feb 2015 3. unknown, dentist, last visit remote Patient Care Team: Unk Pinto, MD as PCP - General (Internal Medicine)  Medication Review:  BABY ASPIRIN PO  Take 81 mg  by mouth daily.     donepezil 10 MG tablet  Commonly known as:  ARICEPT  Take 1 tablet (10 mg total) by mouth at bedtime. For memory      HYDROcodone-acetaminophen 5-325 MG per tablet  Commonly known as:  NORCO/VICODIN  Take 1 tablet by mouth every 6 (six) hours as needed for moderate pain.     losartan 50 MG tablet  Commonly known as:  COZAAR  Take 1 tablet (50 mg total) by mouth daily.     memantine 10 MG tablet  Commonly known as:  NAMENDA  Take 1 tablet (10 mg total) by mouth 2 (two) times daily.     PRESERVISION AREDS 2 PO  Take by mouth daily.     SAW PALMETTO PO  Take by mouth daily.     tiotropium 18 MCG inhalation capsule  Commonly known as:  SPIRIVA  Place 1 capsule (18 mcg total) into inhaler and inhale daily.     VITAMIN D PO  Take 2,000 mg by mouth daily.       Current Problems (verified) Patient Active Problem List   Diagnosis Date Noted  . Encounter for long-term (current) use of other medications 08/29/2013  . SDAT 08/29/2013  . Hypertension 08/29/2013  . Hyperlipidemia   . COPD (chronic obstructive pulmonary disease)   . Prediabetes   . Lung cancer     Screening Tests Health Maintenance  Topic Date Due  . Tetanus/tdap  01/12/1943  . Colonoscopy  01/11/1974  . Zostavax  01/12/1984  . Pneumococcal Polysaccharide Vaccine Age 50 And Over  01/11/1989  . Influenza Vaccine  01/14/2014    Immunization History  Administered Date(s) Administered  . Influenza Split 03/02/2013    Preventative care: Last colonoscopy: remote and declines  Prior vaccinations: TD or Tdap: declines Influenza: 2014 Pneumococcal: declines Shingles/Zostavax: declines  History reviewed: allergies, current medications, past family history, past medical history, past social history, past surgical history and problem list   Risk Factors: Tobacco History  Substance Use Topics  . Smoking status: Former Research scientist (life sciences)  . Smokeless tobacco: Not on file  . Alcohol Use: No   He does not smoke.  Patient is a former smoker. Are there smokers in your home (other than you)?  No  Alcohol Current alcohol use:  none  Caffeine Current caffeine use: coffee 1 /day  Exercise Current exercise: none  Nutrition/Diet Current diet: in general, a "healthy" diet    Cardiac risk factors: advanced age (older than 74 for men, 42 for women), dyslipidemia, family history of premature cardiovascular disease, hypertension, male gender and sedentary lifestyle.  Depression Screen (Note: if answer to either of the following is "Yes", a more complete depression screening is indicated)   Q1: Over the past two weeks, have you felt down, depressed or hopeless? No  Q2: Over the past two weeks, have you felt little interest or pleasure in doing things? No  Have you lost interest or pleasure in daily life? No  Do you often feel hopeless? No  Do you cry easily over simple problems? No  Activities of Daily Living In your present state of health, do you have any difficulty performing the following activities?:  Driving? Yes Managing money?  Yes Feeding yourself? No Getting from bed to chair? No Climbing a flight of stairs? Yes Preparing food and eating?: Yes Bathing or showering? Yes Getting dressed: No Getting to the toilet? No Using the toilet:No Moving around from place to place: No In the past year  have you fallen or had a near fall?:Yes   Are you sexually active?  No  Do you have more than one partner?  No  Vision Difficulties: Yes  Hearing Difficulties: Yes Do you often ask people to speak up or repeat themselves? Yes Do you experience ringing or noises in your ears? No Do you have difficulty understanding soft or whispered voices? Yes  Cognition  Do you feel that you have a problem with memory?Yes  Do you often misplace items? Yes  Do you feel safe at home?  Yes  Advanced directives Does patient have a Ovid? Yes Does patient have a Living Will? Yes   Objective:   Blood pressure 142/88, pulse 80, temperature 97.9 F (36.6 C), resp. rate 16, weight 186 lb (84.369  kg). Body mass index is 27.45 kg/(m^2).  General appearance: alert, no distress, WD/WN, male Cognitive Testing  Alert? Yes  Normal Appearance?Yes  Oriented to person? Yes  Place? Yes   Time? Yes  Recall of three objects?  No  Can perform simple calculations? No  Displays appropriate judgment?No  Can read the correct time from a watch face?No  HEENT: normocephalic, sclerae anicteric, TMs pearly, nares patent, no discharge or erythema, pharynx normal Oral cavity: MMM, no lesions Neck: supple, no lymphadenopathy, no thyromegaly, no masses Heart: RRR, normal S1, S2, no murmurs Lungs: CTA bilaterally, no wheezes, rhonchi, or rales Abdomen: +bs, soft, non tender, non distended, no masses, no hepatomegaly, no splenomegaly Musculoskeletal: nontender, no swelling, no obvious deformity Extremities: no edema, no cyanosis, no clubbing Pulses: 2+ symmetric, upper and lower extremities, normal cap refill Neurological: alert, oriented x 3, CN2-12 intact, strength normal upper extremities and lower extremities, sensation normal throughout, DTRs 2+ throughout, no cerebellar signs, gait normal Psychiatric: normal affect, behavior normal, pleasant   Medicare Attestation I have personally reviewed: The patient's medical and social history Their use of alcohol, tobacco or illicit drugs Their current medications and supplements The patient's functional ability including ADLs,fall risks, home safety risks, cognitive, and hearing and visual impairment Diet and physical activities Evidence for depression or mood disorders  The patient's weight, height, BMI, and visual acuity have been recorded in the chart.  I have made referrals, counseling, and provided education to the patient based on review of the above and I have provided the patient with a written personalized care plan for preventive services.     Vicie Mutters, PA-C   12/05/2013

## 2013-12-19 ENCOUNTER — Other Ambulatory Visit: Payer: Self-pay | Admitting: Physician Assistant

## 2013-12-26 ENCOUNTER — Ambulatory Visit (INDEPENDENT_AMBULATORY_CARE_PROVIDER_SITE_OTHER): Payer: Medicare Other | Admitting: Physician Assistant

## 2013-12-26 ENCOUNTER — Encounter: Payer: Self-pay | Admitting: Physician Assistant

## 2013-12-26 VITALS — BP 128/72 | HR 80 | Temp 98.6°F | Resp 16 | Wt 181.0 lb

## 2013-12-26 DIAGNOSIS — R4182 Altered mental status, unspecified: Secondary | ICD-10-CM

## 2013-12-26 DIAGNOSIS — N3000 Acute cystitis without hematuria: Secondary | ICD-10-CM

## 2013-12-26 DIAGNOSIS — R296 Repeated falls: Secondary | ICD-10-CM

## 2013-12-26 DIAGNOSIS — E860 Dehydration: Secondary | ICD-10-CM

## 2013-12-26 LAB — CBC WITH DIFFERENTIAL/PLATELET
BASOS ABS: 0 10*3/uL (ref 0.0–0.1)
Basophils Relative: 0 % (ref 0–1)
Eosinophils Absolute: 0.4 10*3/uL (ref 0.0–0.7)
Eosinophils Relative: 3 % (ref 0–5)
HCT: 38.2 % — ABNORMAL LOW (ref 39.0–52.0)
Hemoglobin: 12.7 g/dL — ABNORMAL LOW (ref 13.0–17.0)
LYMPHS ABS: 1.9 10*3/uL (ref 0.7–4.0)
LYMPHS PCT: 16 % (ref 12–46)
MCH: 29.7 pg (ref 26.0–34.0)
MCHC: 33.2 g/dL (ref 30.0–36.0)
MCV: 89.3 fL (ref 78.0–100.0)
Monocytes Absolute: 1 10*3/uL (ref 0.1–1.0)
Monocytes Relative: 8 % (ref 3–12)
NEUTROS PCT: 73 % (ref 43–77)
Neutro Abs: 8.8 10*3/uL — ABNORMAL HIGH (ref 1.7–7.7)
PLATELETS: 294 10*3/uL (ref 150–400)
RBC: 4.28 MIL/uL (ref 4.22–5.81)
RDW: 13.9 % (ref 11.5–15.5)
WBC: 12.1 10*3/uL — AB (ref 4.0–10.5)

## 2013-12-26 LAB — COMPREHENSIVE METABOLIC PANEL
ALT: 22 U/L (ref 0–53)
AST: 20 U/L (ref 0–37)
Albumin: 3.8 g/dL (ref 3.5–5.2)
Alkaline Phosphatase: 84 U/L (ref 39–117)
BILIRUBIN TOTAL: 0.4 mg/dL (ref 0.2–1.2)
BUN: 26 mg/dL — ABNORMAL HIGH (ref 6–23)
CO2: 29 mEq/L (ref 19–32)
CREATININE: 1.41 mg/dL — AB (ref 0.50–1.35)
Calcium: 8.9 mg/dL (ref 8.4–10.5)
Chloride: 106 mEq/L (ref 96–112)
Glucose, Bld: 102 mg/dL — ABNORMAL HIGH (ref 70–99)
Potassium: 4.2 mEq/L (ref 3.5–5.3)
SODIUM: 142 meq/L (ref 135–145)
TOTAL PROTEIN: 5.9 g/dL — AB (ref 6.0–8.3)

## 2013-12-26 NOTE — Progress Notes (Signed)
   Subjective:    Patient ID: Logan Grimes, male    DOB: 11-Dec-1921, 80 y.o.   MRN: [id number]  HPI 80 y.o. male with history of SDAT, COPD, lung cancer presents for AMS with grand daughter. For the last week he has not been getting out of bed, had not been feeding himself, dressing self, unknown to location. He has been getting norco 5mg  1/2 tablet for a week, then he got some confusion, they took off the norco but he is not doing well. He has had some nausea/abdominal pain. Denies CP, SOB. O2 at 95%.    Review of Systems  Constitutional: Negative.   HENT: Negative.   Respiratory: Negative.   Cardiovascular: Negative.   Gastrointestinal: Negative.   Genitourinary: Negative.   Neurological: Negative for dizziness, tremors, seizures, syncope, facial asymmetry, speech difficulty, weakness, light-headedness, numbness and headaches.  Psychiatric/Behavioral: Positive for confusion.       Objective:   Physical Exam  Constitutional: He appears well-developed and well-nourished.  HENT:  Head: Normocephalic and atraumatic.  Eyes: Conjunctivae are normal. Pupils are equal, round, and reactive to light.  Neck: Normal range of motion. Neck supple.  Cardiovascular: Normal rate and regular rhythm.   Pulmonary/Chest: Effort normal.  Course breath sounds, decrease RLL  Abdominal: Soft. Bowel sounds are normal.  Musculoskeletal: Normal range of motion.  Lymphadenopathy:    He has no cervical adenopathy.  Neurological: He is alert. Coordination abnormal.  Oriented X1  Skin: Skin is warm and dry.       Assessment & Plan:  AMS- oriented to self only- discontinue the norco- check CBC, CMET, UA C&S, CXR if all else neg.  If worse go to ER

## 2013-12-26 NOTE — Patient Instructions (Signed)

## 2013-12-27 ENCOUNTER — Other Ambulatory Visit: Payer: Self-pay | Admitting: Physician Assistant

## 2013-12-27 ENCOUNTER — Ambulatory Visit (HOSPITAL_COMMUNITY)
Admission: RE | Admit: 2013-12-27 | Discharge: 2013-12-27 | Disposition: A | Discharge status: Home / Self Care | Payer: Medicare Other | Source: Ambulatory Visit | Attending: Physician Assistant | Admitting: Physician Assistant

## 2013-12-27 DIAGNOSIS — R0602 Shortness of breath: Secondary | ICD-10-CM | POA: Insufficient documentation

## 2013-12-27 DIAGNOSIS — J449 Chronic obstructive pulmonary disease, unspecified: Secondary | ICD-10-CM | POA: Insufficient documentation

## 2013-12-27 DIAGNOSIS — R4182 Altered mental status, unspecified: Secondary | ICD-10-CM

## 2013-12-27 DIAGNOSIS — J4489 Other specified chronic obstructive pulmonary disease: Secondary | ICD-10-CM | POA: Insufficient documentation

## 2013-12-27 DIAGNOSIS — D72829 Elevated white blood cell count, unspecified: Secondary | ICD-10-CM | POA: Insufficient documentation

## 2013-12-27 DIAGNOSIS — J984 Other disorders of lung: Secondary | ICD-10-CM | POA: Insufficient documentation

## 2013-12-27 DIAGNOSIS — R059 Cough, unspecified: Secondary | ICD-10-CM | POA: Insufficient documentation

## 2013-12-27 DIAGNOSIS — R05 Cough: Secondary | ICD-10-CM | POA: Insufficient documentation

## 2013-12-27 LAB — URINALYSIS, ROUTINE W REFLEX MICROSCOPIC
Bilirubin Urine: NEGATIVE
GLUCOSE, UA: NEGATIVE mg/dL
HGB URINE DIPSTICK: NEGATIVE
Ketones, ur: NEGATIVE mg/dL
LEUKOCYTES UA: NEGATIVE
Nitrite: NEGATIVE
PH: 5 (ref 5.0–8.0)
Protein, ur: NEGATIVE mg/dL
Specific Gravity, Urine: 1.023 (ref 1.005–1.030)
Urobilinogen, UA: 0.2 mg/dL (ref 0.0–1.0)

## 2013-12-27 MED ORDER — LEVOFLOXACIN 500 MG PO TABS: 500.0000 mg | ORAL_TABLET | Freq: Every day | ORAL | Status: DC

## 2013-12-27 NOTE — Addendum Note (Signed)
Addended by: Vicie Mutters R on: 12/27/2013 08:39 AM   Modules accepted: Orders

## 2013-12-28 LAB — URINE CULTURE
Colony Count: NO GROWTH
Organism ID, Bacteria: NO GROWTH

## 2014-02-21 ENCOUNTER — Other Ambulatory Visit: Payer: Self-pay | Admitting: *Deleted

## 2014-02-21 MED ORDER — TIOTROPIUM BROMIDE MONOHYDRATE 18 MCG IN CAPS: 18.0000 ug | ORAL_CAPSULE | Freq: Every day | RESPIRATORY_TRACT | Status: AC

## 2014-03-13 ENCOUNTER — Encounter: Payer: Self-pay | Admitting: Internal Medicine

## 2014-03-13 ENCOUNTER — Ambulatory Visit (INDEPENDENT_AMBULATORY_CARE_PROVIDER_SITE_OTHER): Payer: Medicare Other | Admitting: Internal Medicine

## 2014-03-13 VITALS — BP 130/88 | HR 76 | Temp 97.9°F | Resp 18 | Ht 69.0 in | Wt 182.8 lb

## 2014-03-13 DIAGNOSIS — R7309 Other abnormal glucose: Secondary | ICD-10-CM

## 2014-03-13 DIAGNOSIS — E559 Vitamin D deficiency, unspecified: Secondary | ICD-10-CM

## 2014-03-13 DIAGNOSIS — E785 Hyperlipidemia, unspecified: Secondary | ICD-10-CM

## 2014-03-13 DIAGNOSIS — I10 Essential (primary) hypertension: Secondary | ICD-10-CM

## 2014-03-13 DIAGNOSIS — R7303 Prediabetes: Secondary | ICD-10-CM

## 2014-03-13 DIAGNOSIS — Z79899 Other long term (current) drug therapy: Secondary | ICD-10-CM

## 2014-03-13 DIAGNOSIS — Z23 Encounter for immunization: Secondary | ICD-10-CM

## 2014-03-13 NOTE — Progress Notes (Signed)
Patient ID: Logan Grimes, male   DOB: 08/15/1921, 80 y.o.   MRN: [id number]   This very nice 80 y.o.male presents for 3 month follow up with Hypertension, Hyperlipidemia, Pre-Diabetes and Vitamin D Deficiency. Also, patient has Hx/o suspected [facility] Lung Cancer followed by [facility] in [place].  Patient is treated for HTN & BP has been controlled at home. Today's BP: 130/88 mmHg. Patient has had no complaints of any cardiac type chest pain, palpitations, dyspnea/orthopnea/PND, dizziness, claudication, or dependent edema.  Hyperlipidemia is controlled with diet & meds. Patient denies myalgias or other med SE's. Last Lipids were Total Chol 167; HDL Chol 48; NKN39; Trig 104 on 12/05/2013.   Also, the patient has history of  PreDiabetes and has had no symptoms of reactive hypoglycemia, diabetic polys, paresthesias or visual blurring.  Last A1c was 5.9% on 12/05/2013.    Further, the patient also has history of Vitamin D Deficiency and supplements vitamin D without any suspected side-effects. Last vitamin D was  77 on 08/29/2013.    Medication List   BABY ASPIRIN PO  Take 81 mg by mouth daily.     donepezil 10 MG tablet  Commonly known as:  ARICEPT  Take 1 tablet (10 mg total) by mouth at bedtime. For memory     HYDROcodone-acetaminophen 5-325 MG per tablet  Commonly known as:  NORCO/VICODIN  Take 1 tablet by mouth every 6 (six) hours as needed for moderate pain.     losartan 50 MG tablet  Commonly known as:  COZAAR  TAKE 1 TABLET BY MOUTH EVERY DAY     memantine 10 MG tablet  Commonly known as:  NAMENDA  Take 1 tablet (10 mg total) by mouth 2 (two) times daily.     PRESERVISION AREDS 2 PO  Take by mouth daily.     SAW PALMETTO PO  Take by mouth daily.     tiotropium 18 MCG inhalation capsule  Commonly known as:  SPIRIVA  Place 1 capsule (18 mcg total) into inhaler and inhale daily.     VITAMIN D PO  Take 2,000 mg by mouth daily.       No Known  Allergies PMHx:   Past Medical History  Diagnosis Date  . Hyperlipidemia   . COPD (chronic obstructive pulmonary disease)   . Prediabetes   . Elevated PSA   . Lung cancer    FHx:    Reviewed / unchanged SHx:    Reviewed / unchanged   Systems Review:  Constitutional: Denies fever, chills, wt changes, headaches, insomnia, fatigue, night sweats, change in appetite. Eyes: Denies redness, blurred vision, diplopia, discharge, itchy, watery eyes.  ENT: Denies discharge, congestion, post nasal drip, epistaxis, sore throat, earache, hearing loss, dental pain, tinnitus, vertigo, sinus pain, snoring.  CV: Denies chest pain, palpitations, irregular heartbeat, syncope, dyspnea, diaphoresis, orthopnea, PND, claudication or edema. Respiratory: denies cough, dyspnea, DOE, pleurisy, hoarseness, laryngitis, wheezing.  Gastrointestinal: Denies dysphagia, odynophagia, heartburn, reflux, water brash, abdominal pain or cramps, nausea, vomiting, bloating, diarrhea, constipation, hematemesis, melena, hematochezia  or hemorrhoids. Genitourinary: Denies dysuria, frequency, urgency, nocturia, hesitancy, discharge, hematuria or flank pain. Musculoskeletal: Denies arthralgias, myalgias, stiffness, jt. swelling, pain, limping or strain/sprain.  Skin: Denies pruritus, rash, hives, warts, acne, eczema or change in skin lesion(s). Neuro: No weakness, tremor, incoordination, spasms, paresthesia or pain. Psychiatric: Denies confusion, memory loss or sensory loss. Endo: Denies change in weight, skin or hair change.  Heme/Lymph: No excessive bleeding, bruising or enlarged lymph nodes.  Exam:  BP 130/88  Pulse 76  Temp(Src) 97.9 F (36.6 C) (Temporal)  Resp 18  Ht 5\' 9"  (1.753 m)  Wt 182 lb 12.8 oz (82.918 kg)  BMI 26.98 kg/m2  Appears well nourished and in no distress. Eyes: PERRLA, EOMs, conjunctiva no swelling or erythema. Sinuses: No frontal/maxillary tenderness ENT/Mouth: EAC's clear, TM's nl w/o erythema,  bulging. Nares clear w/o erythema, swelling, exudates. Oropharynx clear without erythema or exudates. Oral hygiene is good. Tongue normal, non obstructing. Hearing intact.  Neck: Supple. Thyroid nl. Car 2+/2+ without bruits, nodes or JVD. Chest: Respirations nl with BS clear & equal w/o rales, rhonchi, wheezing or stridor.  Cor: Heart sounds normal w/ regular rate and rhythm without sig. murmurs, gallops, clicks, or rubs. Peripheral pulses normal and equal  without edema.  Lymphatics: Unremarkable.  Musculoskeletal: Full ROM all peripheral extremities, joint stability, 5/5 strength, and normal gait.  Skin: Warm, dry without exposed rashes, lesions or ecchymosis apparent.  Neuro: Cranial nerves intact, reflexes equal bilaterally. Sensory-motor testing grossly intact. Tendon reflexes grossly intact.  Pysch: Alert & oriented x 3.  In no distess.  Assessment and Plan:  1. Hypertension - Continue monitor blood pressure at home. Continue diet/meds same.  2. Hyperlipidemia - Continue diet/meds, exercise,& lifestyle modifications. Continue monitor periodic cholesterol/liver & renal functions   3. Pre-Diabetes - Continue diet, exercise, lifestyle modifications. Monitor appropriate labs.  4. Vitamin D Deficiency - Continue supplementation.  5. Hx/o Lung Cancer, suspected  - Family prefers non aggressive management/monitoring as he is ASx  Recommended regular exercise, BP monitoring, weight control, and discussed med and SE's. Recommended labs to assess and monitor clinical status. Further disposition pending results of labs.

## 2014-03-14 LAB — CBC WITH DIFFERENTIAL/PLATELET
BASOS PCT: 1 % (ref 0–1)
Basophils Absolute: 0.1 10*3/uL (ref 0.0–0.1)
Eosinophils Absolute: 0.3 10*3/uL (ref 0.0–0.7)
Eosinophils Relative: 3 % (ref 0–5)
HCT: 36.5 % — ABNORMAL LOW (ref 39.0–52.0)
HEMOGLOBIN: 12.1 g/dL — AB (ref 13.0–17.0)
Lymphocytes Relative: 18 % (ref 12–46)
Lymphs Abs: 1.8 10*3/uL (ref 0.7–4.0)
MCH: 29.4 pg (ref 26.0–34.0)
MCHC: 33.2 g/dL (ref 30.0–36.0)
MCV: 88.8 fL (ref 78.0–100.0)
MONOS PCT: 7 % (ref 3–12)
Monocytes Absolute: 0.7 10*3/uL (ref 0.1–1.0)
NEUTROS ABS: 7 10*3/uL (ref 1.7–7.7)
Neutrophils Relative %: 71 % (ref 43–77)
PLATELETS: 293 10*3/uL (ref 150–400)
RBC: 4.11 MIL/uL — AB (ref 4.22–5.81)
RDW: 13.9 % (ref 11.5–15.5)
WBC: 9.9 10*3/uL (ref 4.0–10.5)

## 2014-03-14 LAB — BASIC METABOLIC PANEL WITH GFR
BUN: 19 mg/dL (ref 6–23)
CALCIUM: 8.9 mg/dL (ref 8.4–10.5)
CO2: 26 mEq/L (ref 19–32)
Chloride: 108 mEq/L (ref 96–112)
Creat: 1.09 mg/dL (ref 0.50–1.35)
GFR, EST NON AFRICAN AMERICAN: 59 mL/min — AB
GFR, Est African American: 69 mL/min
GLUCOSE: 85 mg/dL (ref 70–99)
POTASSIUM: 4.3 meq/L (ref 3.5–5.3)
SODIUM: 144 meq/L (ref 135–145)

## 2014-03-14 LAB — MAGNESIUM: Magnesium: 1.8 mg/dL (ref 1.5–2.5)

## 2014-03-14 LAB — TSH: TSH: 1.522 u[IU]/mL (ref 0.350–4.500)

## 2014-03-14 LAB — HEMOGLOBIN A1C
Hgb A1c MFr Bld: 6.2 % — ABNORMAL HIGH (ref <5.7)
Mean Plasma Glucose: 131 mg/dL — ABNORMAL HIGH (ref <117)

## 2014-03-14 LAB — INSULIN, FASTING: INSULIN FASTING, SERUM: 8.6 u[IU]/mL (ref 2.0–19.6)

## 2014-03-14 LAB — VITAMIN D 25 HYDROXY (VIT D DEFICIENCY, FRACTURES): Vit D, 25-Hydroxy: 62 ng/mL (ref 30–89)

## 2014-05-15 ENCOUNTER — Other Ambulatory Visit: Payer: Self-pay | Admitting: *Deleted

## 2014-05-15 MED ORDER — MORPHINE SULFATE (CONCENTRATE) 20 MG/ML PO SOLN: ORAL | Status: AC

## 2014-05-23 ENCOUNTER — Telehealth: Payer: Self-pay | Admitting: *Deleted

## 2014-05-23 MED ORDER — CEPHALEXIN 250 MG PO CAPS: 250.0000 mg | ORAL_CAPSULE | Freq: Four times a day (QID) | ORAL | Status: AC

## 2014-05-23 NOTE — Telephone Encounter (Signed)
Hospice called and reported patient has an infected right great toe.  He also has a cough and is taking liquid Mucinex.  OK for RX of Keflex,  OK for Mucinex and Hospice will soak toe in epsom salts.

## 2014-05-24 ENCOUNTER — Telehealth: Payer: Self-pay | Admitting: *Deleted

## 2014-05-24 NOTE — Telephone Encounter (Signed)
Logan Grimes, with [facility], called and states patient given less then 72 hours to live.  Requesting a verbal order from Dr. Melford Aase to help ease patient's increasing pain. Patient becoming very irritated and aggressive.  Requesting verbal to increase Roxinol from 10 mg q 2 hours to 10-20 mg q 1 hour and increasing Ativan 1 mg from 1 mg q 6 hours to 1 mg q 4 hours.  Per Dr. Idell Pickles orders, ok to increase Roxinol to 10-20 mg q 1 hour and to titrate as deemed necessary.  Also ok in increase Ativan to 1 mg q 4 hours.

## 2014-06-06 ENCOUNTER — Other Ambulatory Visit: Payer: Self-pay | Admitting: Physician Assistant

## 2014-06-16 DEATH — deceased

## 2014-07-24 ENCOUNTER — Ambulatory Visit: Payer: Self-pay | Admitting: Physician Assistant

## 2014-08-30 ENCOUNTER — Encounter: Payer: Self-pay | Admitting: Internal Medicine

## 2014-10-30 ENCOUNTER — Encounter: Payer: Self-pay | Admitting: Internal Medicine

## 2015-10-20 IMAGING — CR DG CHEST 2V
2 series · 2 of 2 positions shown · non-contrast
Comparison: PA and lateral chest x-ray 01 November, 2011 and CT scan
of the chest of November 03, 2011.

CLINICAL DATA: Shortness of breath and nonproductive cough with
elevated white blood cell count; history of COPD

EXAM:
CHEST  2 VIEW

[view not recorded (1 of 2)]
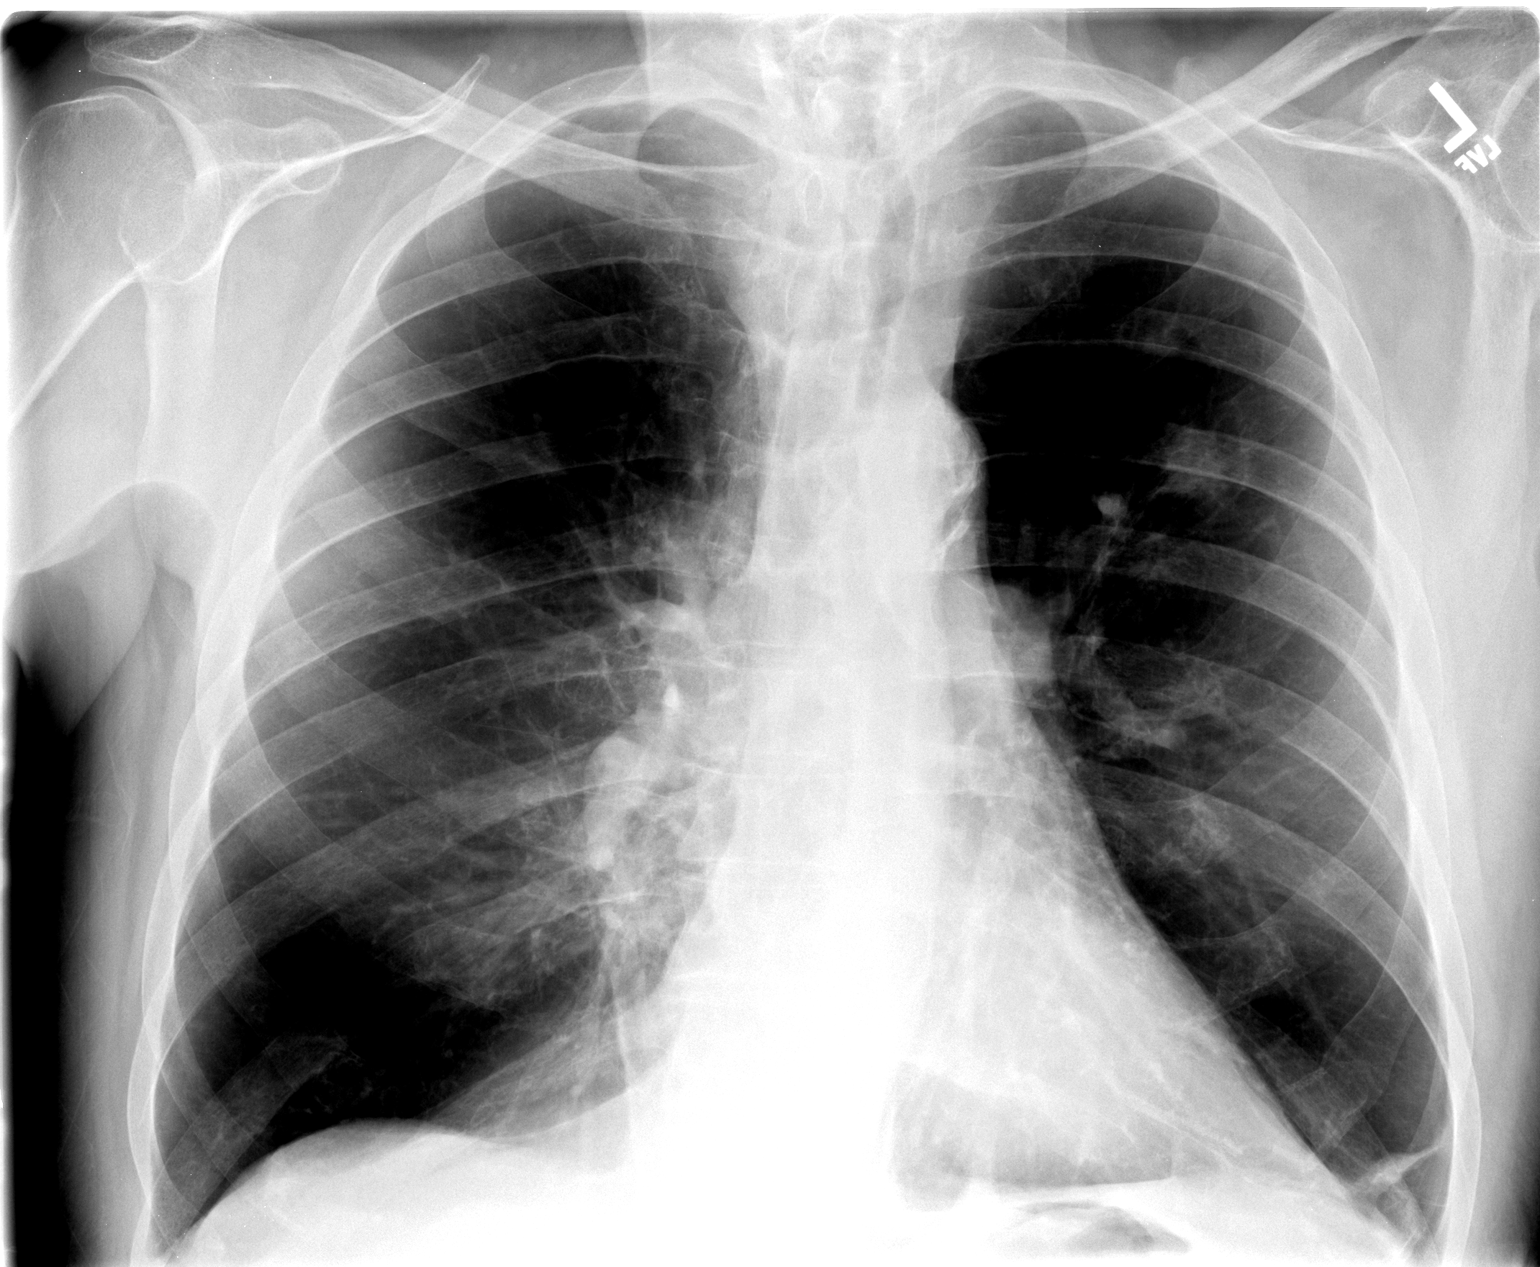

[view not recorded (2 of 2)]
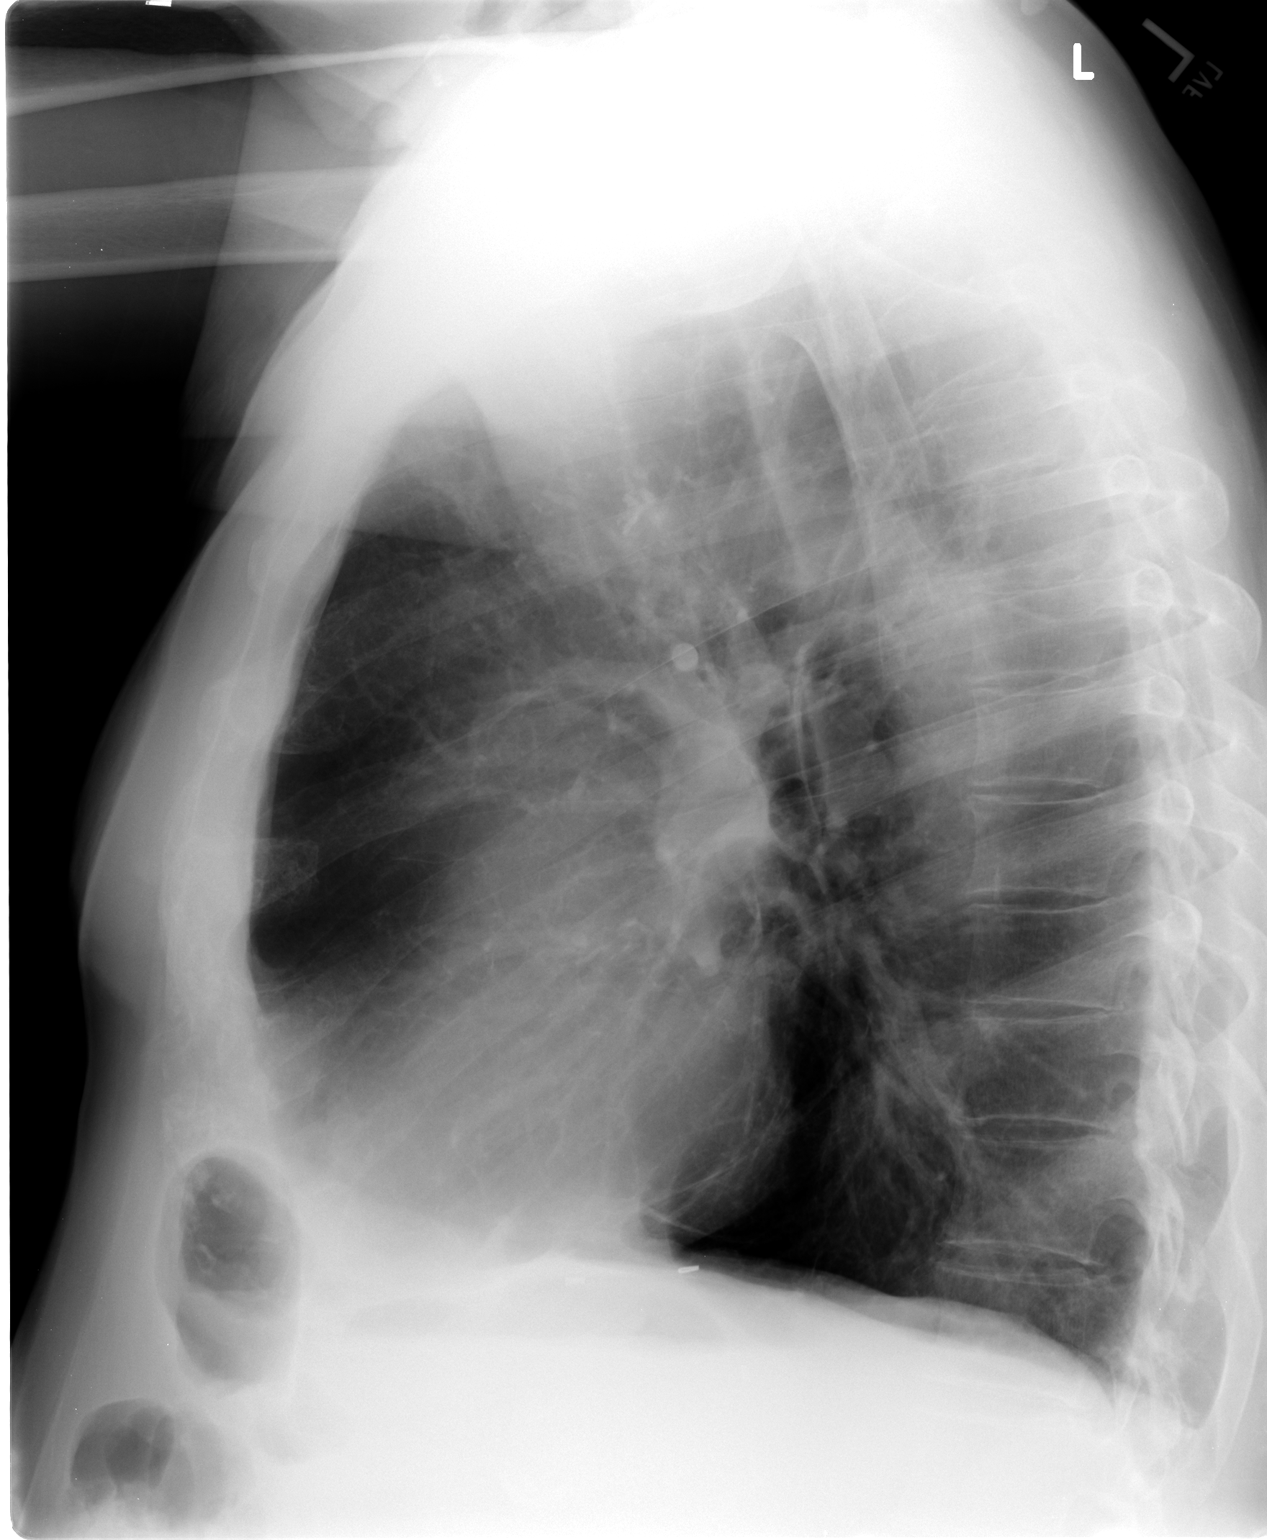

[2 of 2 positions shown; findings below may reference images not displayed]

FINDINGS: The lungs remain hyperinflated with hemidiaphragm flattening.
Density in the left upper lobe is more conspicuous today and
measures nearly 2 cm in diameter. New densities inferior to this are
demonstrated. There is stable scarring in the left lateral
costophrenic gutter. No significant abnormality on the right is
present. There is no pleural effusion. The cardiac silhouette is
normal. The mediastinum is normal in width.
IMPRESSION: 1. Increased conspicuity of a known abnormal nodule in the left
upper lobe is worrisome for progressive malignancy. New patchy
density inferior to this is present.
2. The right lung and mediastinum are normal. There is underlying
COPD.
3. These results were called by telephone at the time of
interpretation on 12/27/2013 at [DATE] to JON FREDRIK BUGGE, PA, , who
verbally acknowledged these results.
